# Patient Record
Sex: Female | Born: 2005 | Hispanic: Yes | Marital: Single | Smoking: Never smoker
Health system: Southern US, Community
[De-identification: ages and names within clinical notes are randomized; demographics above are authoritative.]

## PROBLEM LIST (undated history)

## (undated) DIAGNOSIS — Z9049 Acquired absence of other specified parts of digestive tract: Secondary | ICD-10-CM

## (undated) HISTORY — PX: APPENDECTOMY: SHX54

---
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
## 2023-03-27 ENCOUNTER — Other Ambulatory Visit (HOSPITAL_BASED_OUTPATIENT_CLINIC_OR_DEPARTMENT_OTHER): Payer: Self-pay

## 2023-03-27 ENCOUNTER — Emergency Department (HOSPITAL_BASED_OUTPATIENT_CLINIC_OR_DEPARTMENT_OTHER): Payer: BC Managed Care – PPO | Admitting: Radiology

## 2023-03-27 ENCOUNTER — Encounter (HOSPITAL_BASED_OUTPATIENT_CLINIC_OR_DEPARTMENT_OTHER): Payer: Self-pay | Admitting: Emergency Medicine

## 2023-03-27 ENCOUNTER — Emergency Department (HOSPITAL_BASED_OUTPATIENT_CLINIC_OR_DEPARTMENT_OTHER)
Admission: EM | Admit: 2023-03-27 | Discharge: 2023-03-27 | Disposition: A | Discharge status: Home / Self Care | Payer: BC Managed Care – PPO | Attending: Emergency Medicine | Admitting: Emergency Medicine

## 2023-03-27 ENCOUNTER — Other Ambulatory Visit: Payer: Self-pay

## 2023-03-27 DIAGNOSIS — R0789 Other chest pain: Secondary | ICD-10-CM | POA: Diagnosis not present

## 2023-03-27 DIAGNOSIS — R079 Chest pain, unspecified: Secondary | ICD-10-CM | POA: Diagnosis present

## 2023-03-27 LAB — BASIC METABOLIC PANEL
Anion gap: 10 (ref 5–15)
BUN: 8 mg/dL (ref 6–20)
CO2: 24 mmol/L (ref 22–32)
Calcium: 9.4 mg/dL (ref 8.9–10.3)
Chloride: 102 mmol/L (ref 98–111)
Creatinine, Ser: 0.67 mg/dL (ref 0.44–1.00)
GFR, Estimated: >60 mL/min (ref >60)
Glucose, Bld: 77 mg/dL (ref 70–99)
Potassium: 3.9 mmol/L (ref 3.5–5.1)
Sodium: 136 mmol/L (ref 135–145)

## 2023-03-27 LAB — CBC
HCT: 40.3 % (ref 36.0–46.0)
Hemoglobin: 13.1 g/dL (ref 12.0–15.0)
MCH: 28.1 pg (ref 26.0–34.0)
MCHC: 32.5 g/dL (ref 30.0–36.0)
MCV: 86.3 fL (ref 80.0–100.0)
Platelets: 340 10*3/uL (ref 150–400)
RBC: 4.67 MIL/uL (ref 3.87–5.11)
RDW: 13.7 % (ref 11.5–15.5)
WBC: 9 10*3/uL (ref 4.0–10.5)
nRBC: 0 % (ref 0.0–0.2)

## 2023-03-27 LAB — TROPONIN I (HIGH SENSITIVITY): Troponin I (High Sensitivity): <2 ng/L (ref <18)

## 2023-03-27 LAB — PREGNANCY, URINE: Preg Test, Ur: NEGATIVE

## 2023-03-27 MED ORDER — KETOROLAC TROMETHAMINE 15 MG/ML IJ SOLN
15.0000 mg | Freq: Once | INTRAMUSCULAR | Status: AC
  Administered 2023-03-27: 15 mg via INTRAVENOUS
  Filled 2023-03-27: qty 1

## 2023-03-27 MED ORDER — KETOROLAC TROMETHAMINE 10 MG PO TABS
10.0000 mg | ORAL_TABLET | Freq: Four times a day (QID) | ORAL | 0 refills | Status: AC | PRN
  Filled 2023-03-27: qty 20, 5d supply, fill #0

## 2023-03-27 NOTE — ED Notes (Signed)
Urine sample sent from lobby collection site.

## 2023-03-27 NOTE — ED Notes (Signed)
Patient transported to X-ray 
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
## 2023-03-27 NOTE — Discharge Instructions (Signed)
As discussed, workup today overall reassuring.  Your heart enzyme was normal along with your EKG.  Chest x-ray did not show evidence of obvious fracture, collapsed lung, pneumonia or other abnormality.  Will recommend treatment of your pain at home with NSAIDs.  Will send a medication called Toradol to use as needed.  This is in the same family as Aleve, ibuprofen so please do not take these medications together.  Will recommend close follow-up with your primary care for reassessment of your symptoms.  Please do not hesitate to return to the emergency department for worrisome signs and symptoms we discussed become apparent.
